# Patient Record
Sex: Male | Born: 1990 | Race: White | Hispanic: No | Marital: Married | Smoking: Never smoker
Health system: Southern US, Community
[De-identification: ages and names within clinical notes are randomized; demographics above are authoritative.]

## PROBLEM LIST (undated history)

## (undated) HISTORY — PX: OTHER SURGICAL HISTORY: SHX169

## (undated) HISTORY — PX: WRIST SURGERY: SHX841

---
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
## 2011-11-09 ENCOUNTER — Other Ambulatory Visit: Payer: Self-pay | Admitting: *Deleted

## 2011-11-09 ENCOUNTER — Ambulatory Visit: Payer: Managed Care, Other (non HMO)

## 2011-11-09 ENCOUNTER — Ambulatory Visit (INDEPENDENT_AMBULATORY_CARE_PROVIDER_SITE_OTHER): Payer: Managed Care, Other (non HMO) | Admitting: Family Medicine

## 2011-11-09 VITALS — BP 110/60 | HR 76 | Temp 98.6°F | Resp 16 | Ht 74.5 in | Wt 215.0 lb

## 2011-11-09 DIAGNOSIS — M25521 Pain in right elbow: Secondary | ICD-10-CM

## 2011-11-09 DIAGNOSIS — M25529 Pain in unspecified elbow: Secondary | ICD-10-CM

## 2011-11-09 DIAGNOSIS — M79609 Pain in unspecified limb: Secondary | ICD-10-CM

## 2011-11-09 DIAGNOSIS — S52123A Displaced fracture of head of unspecified radius, initial encounter for closed fracture: Secondary | ICD-10-CM

## 2011-11-09 DIAGNOSIS — S52121A Displaced fracture of head of right radius, initial encounter for closed fracture: Secondary | ICD-10-CM

## 2011-11-09 MED ORDER — HYDROCODONE-ACETAMINOPHEN 5-500 MG PO TABS: 1.0000 | ORAL_TABLET | Freq: Three times a day (TID) | ORAL | Status: AC | PRN

## 2011-11-09 MED ORDER — HYDROCODONE-ACETAMINOPHEN 5-500 MG PO TABS: 1.0000 | ORAL_TABLET | Freq: Three times a day (TID) | ORAL | Status: DC | PRN

## 2011-11-09 NOTE — Progress Notes (Signed)
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 Patient was playing tennis this morning at about 9:30 when he fell going over the neck. His whole body fell on his right side crushing his right arm and elbow. He's had incredible pain since then although he has tried to play more tennis, swimming, and even drive a stick shift today. He's right handed.  Objective: Patient in moderate pain Patient's unable to move his elbow either flexion or extension is equal to 90. Is unable to pronate or supinate either. He is exquisitely tender over the proximal radius.  [facility] reading (PRIMARY) by  Dr. Milus Glazier:  Right elbow radial head impaction fx  Assessment:   Right elbow radial head impaction fx  1. Fracture of radial head, right, closed    2. Right elbow pain  DG Elbow Complete Right

## 2012-11-09 ENCOUNTER — Ambulatory Visit: Payer: Managed Care, Other (non HMO)

## 2012-11-09 ENCOUNTER — Ambulatory Visit (INDEPENDENT_AMBULATORY_CARE_PROVIDER_SITE_OTHER): Payer: Managed Care, Other (non HMO) | Admitting: Family Medicine

## 2012-11-09 VITALS — BP 122/80 | HR 60 | Temp 98.0°F | Resp 16 | Ht 75.0 in | Wt 214.0 lb

## 2012-11-09 DIAGNOSIS — M79644 Pain in right finger(s): Secondary | ICD-10-CM

## 2012-11-09 DIAGNOSIS — S6390XA Sprain of unspecified part of unspecified wrist and hand, initial encounter: Secondary | ICD-10-CM

## 2012-11-09 DIAGNOSIS — S63619A Unspecified sprain of unspecified finger, initial encounter: Secondary | ICD-10-CM

## 2012-11-09 DIAGNOSIS — M79609 Pain in unspecified limb: Secondary | ICD-10-CM

## 2012-11-09 NOTE — Patient Instructions (Addendum)
Please follow up this Sunday 11/15/12.  Finger Sprain A finger sprain is a tear in one of the strong, fibrous tissues that connect the bones (ligaments) in your finger. The severity of the sprain depends on how much of the ligament is torn. The tear can be either partial or complete. CAUSES  Often, sprains are a result of a fall or accident. If you extend your hands to catch an object or to protect yourself, the force of the impact causes the fibers of your ligament to stretch too much. This excess tension causes the fibers of your ligament to tear. SYMPTOMS  You may have some loss of motion in your finger. Other symptoms include:  Bruising.  Tenderness.  Swelling. DIAGNOSIS  In order to diagnose finger sprain, your caregiver will physically examine your finger or thumb to determine how torn the ligament is. Your caregiver may also suggest an X-ray exam of your finger to make sure no bones are broken. TREATMENT  If your ligament is only partially torn, treatment usually involves keeping the finger in a fixed position (immobilization) for a short period. To do this, your caregiver will apply a bandage, cast, or splint to keep your finger from moving until it heals. For a partially torn ligament, the healing process usually takes 2 to 3 weeks. If your ligament is completely torn, you may need surgery to reconnect the ligament to the bone. After surgery a cast or splint will be applied and will need to stay on your finger or thumb for 4 to 6 weeks while your ligament heals. HOME CARE INSTRUCTIONS  Keep your injured finger elevated, when possible, to decrease swelling.  To ease pain and swelling, apply ice to your joint twice a day, for 2 to 3 days:  Put ice in a plastic bag.  Place a towel between your skin and the bag.  Leave the ice on for 15 minutes.  Only take over-the-counter or prescription medicine for pain as directed by your caregiver.  Do not wear rings on your injured  finger.  Do not leave your finger unprotected until pain and stiffness go away (usually 3 to 4 weeks).  Do not allow your cast or splint to get wet. Cover your cast or splint with a plastic bag when you shower or bathe. Do not swim.  Your caregiver may suggest special exercises for you to do during your recovery to prevent or limit permanent stiffness. SEEK IMMEDIATE MEDICAL CARE IF:  Your cast or splint becomes damaged.  Your pain becomes worse rather than better. MAKE SURE YOU:  Understand these instructions.  Will watch your condition.  Will get help right away if you are not doing well or get worse. Document Released: 05/30/2004 Document Revised: 07/15/2011 Document Reviewed: 12/24/2010 [facility] Patient Information 2014 [place], [place].

## 2012-11-09 NOTE — Progress Notes (Signed)
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 He's had pain in the middle phalanx ever since. Is also some swelling and tenderness. Patient is right-handed.  Objective: Patient seen in the presence of his wife and infant son. Is in no acute distress.  Examination the right index finger reveals tenderness in the middle phalanx, faint ecchymosis on the volar PIP joint, and inability to flex the finger.  [facility] reading (PRIMARY) by  Dr. Milus Glazier:  Right index finger..no fracture seen  Assessment:  Finger sprain  Plan:  Splint finger, recheck in a week  Signed, Elvina Sidle

## 2014-03-21 ENCOUNTER — Ambulatory Visit (INDEPENDENT_AMBULATORY_CARE_PROVIDER_SITE_OTHER): Payer: Managed Care, Other (non HMO) | Admitting: Emergency Medicine

## 2014-03-21 VITALS — BP 122/80 | HR 77 | Temp 98.3°F | Resp 16 | Ht 74.0 in | Wt 217.8 lb

## 2014-03-21 DIAGNOSIS — M5432 Sciatica, left side: Secondary | ICD-10-CM

## 2014-03-21 MED ORDER — TRAMADOL HCL 50 MG PO TABS: 50.0000 mg | ORAL_TABLET | Freq: Three times a day (TID) | ORAL | Status: AC | PRN

## 2014-03-21 MED ORDER — NAPROXEN SODIUM 550 MG PO TABS: 550.0000 mg | ORAL_TABLET | Freq: Two times a day (BID) | ORAL | Status: AC

## 2014-03-21 NOTE — Patient Instructions (Signed)

## 2014-03-21 NOTE — Progress Notes (Signed)
[facility] [address], [place] [id number] [phone]  Date:  03/21/2014   Name:  Devin Nguyen Bertagnolli   DOB:  05/11/1990   MRN:  [id number]  PCP:  No PCP Per Patient    Chief Complaint: Back Pain; Leg Pain; and Flu Vaccine   History of Present Illness:  Devin Nguyen Kable is a 23 y.o. very pleasant male patient who presents with the following:  6 weeks duration pain in lower left back into the posterior mid thigh and into left scrotum.  No history of injury No overuse Some numbness in the lower back and top of the thigh.  No weakness Interferes with sleep and trouble with prolonged standing or sitting No improvement with over the counter medications or other home remedies.  Denies other complaint or health concern today.    There are no active problems to display for this patient.   History reviewed. No pertinent past medical history.  Past Surgical History  Procedure Laterality Date  . Wrist surgery    . Tailbone surgery      History  Substance Use Topics  . Smoking status: Never Smoker   . Smokeless tobacco: Not on file  . Alcohol Use: Not on file    History reviewed. No pertinent family history.  Allergies  Allergen Reactions  . Vicodin [Hydrocodone-Acetaminophen]     Medication list has been reviewed and updated.  No current outpatient prescriptions on file prior to visit.   No current facility-administered medications on file prior to visit.    Review of Systems:  As per HPI, otherwise negative.    Physical Examination: Filed Vitals:   03/21/14 1702  BP: 122/80  Pulse: 77  Temp: 98.3 F (36.8 C)  Resp: 16   Filed Vitals:   03/21/14 1702  Height: 6\' 2"  (1.88 m)  Weight: 217 lb 12.8 oz (98.793 kg)   Body mass index is 27.95 kg/(m^2). Ideal Body Weight: Weight in (lb) to have BMI = 25: 194.3   GEN: WDWN, NAD, Non-toxic, Alert & Oriented x 3 HEENT: Atraumatic, Normocephalic.  Ears and Nose: No external  deformity. EXTR: No clubbing/cyanosis/edema NEURO: Normal gait.  PSYCH: Normally interactive. Conversant. Not depressed or anxious appearing.  Calm demeanor.  BACK:  Tender left lower back Motor intact  Assessment and Plan: Sciatic neuritis MR Signed,  Phillips OdorJeffery Haru Anspaugh, MD

## 2014-03-24 ENCOUNTER — Telehealth: Payer: Self-pay

## 2014-03-24 ENCOUNTER — Other Ambulatory Visit: Payer: Self-pay

## 2014-03-24 NOTE — Telephone Encounter (Signed)
Patients insurance [employer] requires peer to peer through [employer]- phone number is: [phone] Case number: [id number] Cpt code: [id number]- mri lumbar spine wo contrast [employer] ID number: [id number]  Patients precert was denied because the medical reviewer said that spinal imaging not necessary with in first 6 weeks of diagnosis.

## 2014-03-25 NOTE — Telephone Encounter (Signed)
Let patient know his insurance denied his MRI.  If he is still having pain in two weeks, come back in and we will ordre again

## 2014-03-25 NOTE — Telephone Encounter (Signed)
LM for pt to RTC in 2 weeks if still feeling pain.

## 2014-03-29 ENCOUNTER — Other Ambulatory Visit: Payer: Self-pay

## 2014-08-20 IMAGING — CR DG FINGER INDEX 2+V*R*
1 series · 1 of 1 positions shown · non-contrast
Comparison: None

CLINICAL DATA: Basketball injury, pain

RIGHT INDEX FINGER 2+V

[PA]
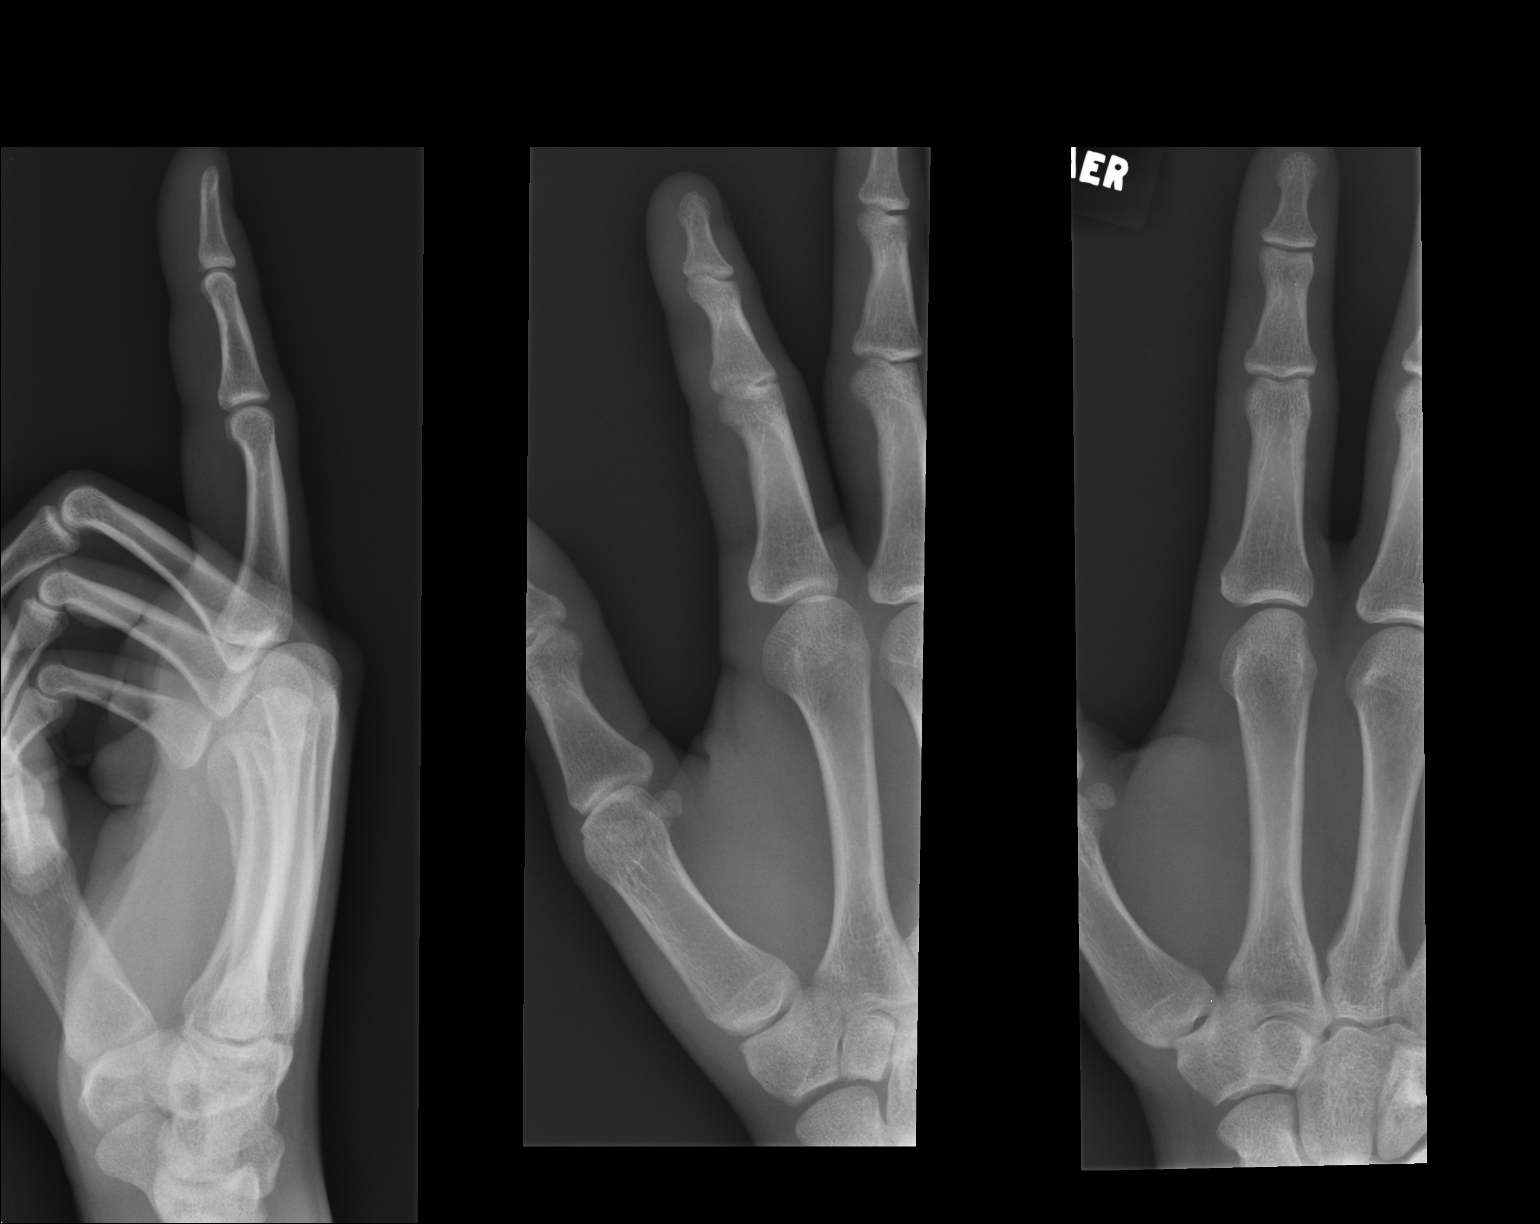

[1 of 1 positions shown; findings below may reference images not displayed]

FINDINGS: Negative for fracture.  Normal alignment and no
arthropathy.
IMPRESSION: Negative

Clinically significant discrepancy from primary report, if
provided: None
